# Patient Record
Sex: Female | Born: 1975 | Race: White | Hispanic: No | Smoking: Never smoker
Health system: Southern US, Community
[De-identification: ages and names within clinical notes are randomized; demographics above are authoritative.]

## PROBLEM LIST (undated history)

## (undated) HISTORY — PX: BREAST ENHANCEMENT SURGERY: SHX7

---

## 2009-05-23 ENCOUNTER — Emergency Department (HOSPITAL_BASED_OUTPATIENT_CLINIC_OR_DEPARTMENT_OTHER): Admission: EM | Admit: 2009-05-23 | Discharge: 2009-05-23 | Payer: Self-pay | Admitting: Emergency Medicine

## 2014-12-27 ENCOUNTER — Encounter (HOSPITAL_COMMUNITY): Payer: Self-pay | Admitting: *Deleted

## 2014-12-27 ENCOUNTER — Emergency Department (HOSPITAL_COMMUNITY): Payer: BLUE CROSS/BLUE SHIELD

## 2014-12-27 DIAGNOSIS — R413 Other amnesia: Secondary | ICD-10-CM | POA: Diagnosis present

## 2014-12-27 DIAGNOSIS — R200 Anesthesia of skin: Secondary | ICD-10-CM | POA: Insufficient documentation

## 2014-12-27 DIAGNOSIS — G454 Transient global amnesia: Principal | ICD-10-CM | POA: Insufficient documentation

## 2014-12-27 DIAGNOSIS — N390 Urinary tract infection, site not specified: Secondary | ICD-10-CM | POA: Diagnosis not present

## 2014-12-27 DIAGNOSIS — R51 Headache: Secondary | ICD-10-CM | POA: Insufficient documentation

## 2014-12-27 LAB — COMPREHENSIVE METABOLIC PANEL
ALBUMIN: 3.9 g/dL (ref 3.5–5.0)
ALT: 22 U/L (ref 14–54)
AST: 28 U/L (ref 15–41)
Alkaline Phosphatase: 42 U/L (ref 38–126)
Anion gap: 8 (ref 5–15)
BUN: 13 mg/dL (ref 6–20)
CHLORIDE: 102 mmol/L (ref 101–111)
CO2: 25 mmol/L (ref 22–32)
Calcium: 8.7 mg/dL — ABNORMAL LOW (ref 8.9–10.3)
Creatinine, Ser: 0.73 mg/dL (ref 0.44–1.00)
GFR calc Af Amer: >60 mL/min (ref >60)
GFR calc non Af Amer: >60 mL/min (ref >60)
GLUCOSE: 93 mg/dL (ref 65–99)
POTASSIUM: 3.6 mmol/L (ref 3.5–5.1)
Sodium: 135 mmol/L (ref 135–145)
Total Bilirubin: 0.3 mg/dL (ref 0.3–1.2)
Total Protein: 7 g/dL (ref 6.5–8.1)

## 2014-12-27 LAB — URINALYSIS, ROUTINE W REFLEX MICROSCOPIC
Bilirubin Urine: NEGATIVE
GLUCOSE, UA: NEGATIVE mg/dL
Ketones, ur: NEGATIVE mg/dL
Nitrite: NEGATIVE
PH: 6.5 (ref 5.0–8.0)
PROTEIN: NEGATIVE mg/dL
SPECIFIC GRAVITY, URINE: 1.003 — AB (ref 1.005–1.030)
Urobilinogen, UA: 0.2 mg/dL (ref 0.0–1.0)

## 2014-12-27 LAB — CBC
HCT: 39 % (ref 36.0–46.0)
Hemoglobin: 14.2 g/dL (ref 12.0–15.0)
MCH: 33.5 pg (ref 26.0–34.0)
MCHC: 36.4 g/dL — ABNORMAL HIGH (ref 30.0–36.0)
MCV: 92 fL (ref 78.0–100.0)
PLATELETS: 260 10*3/uL (ref 150–400)
RBC: 4.24 MIL/uL (ref 3.87–5.11)
RDW: 11.4 % — AB (ref 11.5–15.5)
WBC: 8.3 10*3/uL (ref 4.0–10.5)

## 2014-12-27 LAB — URINE MICROSCOPIC-ADD ON

## 2014-12-27 NOTE — ED Notes (Addendum)
pts friend states that the facial pt was getting increases heat and blood flow to face. Pt has had facial in the past.

## 2014-12-27 NOTE — ED Notes (Addendum)
Pt states that she was getting a facial and experienced short term memory loss. States that she could not recall her grandsons name or her friends husbands name. Also c/o rt hand numbness. Symptoms last less than 20 minutes. Spoke with Dr Madilyn Hook regarding pt, orders received.

## 2014-12-28 ENCOUNTER — Inpatient Hospital Stay (HOSPITAL_COMMUNITY): Payer: BLUE CROSS/BLUE SHIELD

## 2014-12-28 ENCOUNTER — Observation Stay (HOSPITAL_COMMUNITY)
Admission: EM | Admit: 2014-12-28 | Discharge: 2014-12-28 | Disposition: A | Discharge status: Home / Self Care | Payer: BLUE CROSS/BLUE SHIELD | Attending: Internal Medicine | Admitting: Internal Medicine

## 2014-12-28 ENCOUNTER — Encounter (HOSPITAL_COMMUNITY): Payer: Self-pay | Admitting: *Deleted

## 2014-12-28 ENCOUNTER — Observation Stay (HOSPITAL_BASED_OUTPATIENT_CLINIC_OR_DEPARTMENT_OTHER)
Admit: 2014-12-28 | Discharge: 2014-12-28 | Disposition: A | Discharge status: Home / Self Care | Payer: BLUE CROSS/BLUE SHIELD | Attending: Neurology | Admitting: Neurology

## 2014-12-28 DIAGNOSIS — R410 Disorientation, unspecified: Secondary | ICD-10-CM | POA: Diagnosis not present

## 2014-12-28 DIAGNOSIS — R202 Paresthesia of skin: Secondary | ICD-10-CM

## 2014-12-28 DIAGNOSIS — R413 Other amnesia: Secondary | ICD-10-CM | POA: Diagnosis not present

## 2014-12-28 DIAGNOSIS — N3000 Acute cystitis without hematuria: Secondary | ICD-10-CM | POA: Diagnosis not present

## 2014-12-28 DIAGNOSIS — N390 Urinary tract infection, site not specified: Secondary | ICD-10-CM | POA: Diagnosis present

## 2014-12-28 LAB — TSH: TSH: 2.808 u[IU]/mL (ref 0.350–4.500)

## 2014-12-28 LAB — RAPID URINE DRUG SCREEN, HOSP PERFORMED
AMPHETAMINES: NOT DETECTED
Barbiturates: NOT DETECTED
Benzodiazepines: NOT DETECTED
COCAINE: NOT DETECTED
OPIATES: NOT DETECTED
TETRAHYDROCANNABINOL: NOT DETECTED

## 2014-12-28 LAB — VITAMIN B12: Vitamin B-12: 680 pg/mL (ref 180–914)

## 2014-12-28 LAB — CORTISOL: CORTISOL PLASMA: 6.1 ug/dL

## 2014-12-28 MED ORDER — CIPROFLOXACIN HCL 500 MG PO TABS: 500.0000 mg | ORAL_TABLET | Freq: Two times a day (BID) | ORAL | Status: AC

## 2014-12-28 MED ORDER — ONDANSETRON HCL 4 MG/2ML IJ SOLN: 4.0000 mg | Freq: Four times a day (QID) | INTRAMUSCULAR | Status: DC | PRN

## 2014-12-28 MED ORDER — CIPROFLOXACIN HCL 500 MG PO TABS
750.0000 mg | ORAL_TABLET | Freq: Two times a day (BID) | ORAL | Status: DC
  Administered 2014-12-28: 750 mg via ORAL
  Filled 2014-12-28 (×2): qty 1

## 2014-12-28 MED ORDER — ACETAMINOPHEN 325 MG PO TABS: 650.0000 mg | ORAL_TABLET | Freq: Four times a day (QID) | ORAL | Status: DC | PRN

## 2014-12-28 MED ORDER — IBUPROFEN 200 MG PO TABS
600.0000 mg | ORAL_TABLET | Freq: Once | ORAL | Status: DC
  Filled 2014-12-28: qty 3

## 2014-12-28 NOTE — Procedures (Signed)
ELECTROENCEPHALOGRAM REPORT  Date of Study: 12/28/2014  Patient's Name: Stellarose Hapke MRN: [id number] Date of Birth: [birth year]  Referring Provider: Dr. Elspeth Cho  Clinical History: This is a 39 year old woman who had acute onset of memory loss lasting 20 minutes.   Medications: acetaminophen (TYLENOL) tablet 650 mg ciprofloxacin (CIPRO) tablet 750 mg ibuprofen (ADVIL,MOTRIN) tablet 600 mg ondansetron (ZOFRAN) injection 4 mg  Technical Summary: A multichannel digital EEG recording measured by the international 10-20 system with electrodes applied with paste and impedances below 5000 ohms performed in our laboratory with EKG monitoring in an awake and drowsy patient.  Hyperventilation and photic stimulation were performed.  The digital EEG was referentially recorded, reformatted, and digitally filtered in a variety of bipolar and referential montages for optimal display.    Description: The patient is awake and drowsy during the recording.  During maximal wakefulness, there is a symmetric, medium voltage 9 Hz posterior dominant rhythm that attenuates with eye opening.  The record is symmetric.  During drowsiness, there is an increase in theta slowing of the background. Deeper stages of sleep were not seen. Hyperventilation and photic stimulation did not elicit any abnormalities.  There were no epileptiform discharges or electrographic seizures seen.    EKG lead was unremarkable.  Impression: This awake and drowsy EEG is normal.    Clinical Correlation: A normal EEG does not exclude a clinical diagnosis of epilepsy. Clinical correlation is advised.   Patrcia Dolly, M.D.

## 2014-12-28 NOTE — Consult Note (Signed)
Reason for Consult:Episode of confusion Referring Physician: Oni  CC: Episode of confusion  HPI: Erin Stewart is an 39 y.o. female who reports that she was getting a facial today.  While talking with her friend had the acute onset of memory loss.  Could not remember the people she was talking about.  Could not remember her grandson's name.  Memory returned after about 20 minutes.  She decided to go out to eat at that time.  While eating her right thumb became numb for a few minutes.  Patient decided to present for follow up at that time. Patient was fully aware of both events.   Has had no previous similar events.     History reviewed. No pertinent past medical history.  History reviewed. No pertinent past surgical history.  Family history: Mother alive and well.  Father with CAD s/p MIx2  Social History:  reports that she has never smoked. She does not have any smokeless tobacco history on file. She reports that she drinks alcohol. She reports that she does not use illicit drugs.  No Known Allergies  Medications: I have reviewed the patient's current medications. Prior to Admission:  None  ROS: History obtained from the patient  General ROS: negative for - chills, fatigue, fever, night sweats, weight gain or weight loss Psychological ROS: as noted in HPI Ophthalmic ROS: negative for - blurry vision, double vision, eye pain or loss of vision ENT ROS: negative for - epistaxis, nasal discharge, oral lesions, sore throat, tinnitus or vertigo Allergy and Immunology ROS: negative for - hives or itchy/watery eyes Hematological and Lymphatic ROS: negative for - bleeding problems, bruising or swollen lymph nodes Endocrine ROS: negative for - galactorrhea, hair pattern changes, polydipsia/polyuria or temperature intolerance Respiratory ROS: negative for - cough, hemoptysis, shortness of breath or wheezing Cardiovascular ROS: negative for - chest pain, dyspnea on exertion, edema or irregular  heartbeat Gastrointestinal ROS: negative for - abdominal pain, diarrhea, hematemesis, nausea/vomiting or stool incontinence Genito-Urinary ROS: negative for - dysuria, hematuria, incontinence or urinary frequency/urgency Musculoskeletal ROS: negative for - joint swelling or muscular weakness Neurological ROS: as noted in HPI Dermatological ROS: negative for rash and skin lesion changes  Physical Examination: Blood pressure 104/71, pulse 65, temperature 98.4 F (36.9 C), temperature source Oral, resp. rate 16, last menstrual period 12/20/2014, SpO2 98 %.  HEENT-  Normocephalic, no lesions, without obvious abnormality.  Normal external eye and conjunctiva.  Normal TM's bilaterally.  Normal auditory canals and external ears. Normal external nose, mucus membranes and septum.  Normal pharynx. Cardiovascular- S1, S2 normal, pulses palpable throughout   Lungs- chest clear, no wheezing, rales, normal symmetric air entry Abdomen- soft, non-tender; bowel sounds normal; no masses,  no organomegaly Extremities- no edema Lymph-no adenopathy palpable Musculoskeletal-no joint tenderness, deformity or swelling Skin-warm and dry, no hyperpigmentation, vitiligo, or suspicious lesions  Neurological Examination Mental Status: Alert, oriented, thought content appropriate.  Speech fluent without evidence of aphasia.  Able to follow 3 step commands without difficulty. Cranial Nerves: II: Discs flat bilaterally; Visual fields grossly normal, pupils equal, round, reactive to light and accommodation III,IV, VI: ptosis not present, extra-ocular motions intact bilaterally V,VII: smile symmetric, facial light touch sensation normal bilaterally VIII: hearing normal bilaterally IX,X: gag reflex present XI: bilateral shoulder shrug XII: midline tongue extension Motor: Right : Upper extremity   5/5    Left:     Upper extremity   5/5  Lower extremity   5/5     Lower extremity  5/5 Tone and bulk:normal tone  throughout; no atrophy noted Sensory: Pinprick and light touch intact throughout, bilaterally Deep Tendon Reflexes: 2+ and symmetric throughout Plantars: Right: downgoing   Left: downgoing Cerebellar: normal finger-to-nose, normal rapid alternating movements and normal heel-to-shin test Gait: normal gait and station      Laboratory Studies:   Basic Metabolic Panel:  Recent Labs Lab 12/27/14 2217  NA 135  K 3.6  CL 102  CO2 25  GLUCOSE 93  BUN 13  CREATININE 0.73  CALCIUM 8.7*    Liver Function Tests:  Recent Labs Lab 12/27/14 2217  AST 28  ALT 22  ALKPHOS 42  BILITOT 0.3  PROT 7.0  ALBUMIN 3.9   No results for input(s): LIPASE, AMYLASE in the last 168 hours. No results for input(s): AMMONIA in the last 168 hours.  CBC:  Recent Labs Lab 12/27/14 2217  WBC 8.3  HGB 14.2  HCT 39.0  MCV 92.0  PLT 260    Cardiac Enzymes: No results for input(s): CKTOTAL, CKMB, CKMBINDEX, TROPONINI in the last 168 hours.  BNP: Invalid input(s): POCBNP  CBG: No results for input(s): GLUCAP in the last 168 hours.  Microbiology: No results found for this or any previous visit.  Coagulation Studies: No results for input(s): LABPROT, INR in the last 72 hours.  Urinalysis:  Recent Labs Lab 12/27/14 2200  COLORURINE YELLOW  LABSPEC 1.003*  PHURINE 6.5  GLUCOSEU NEGATIVE  HGBUR MODERATE*  BILIRUBINUR NEGATIVE  KETONESUR NEGATIVE  PROTEINUR NEGATIVE  UROBILINOGEN 0.2  NITRITE NEGATIVE  LEUKOCYTESUR MODERATE*    Lipid Panel:  No results found for: CHOL, TRIG, HDL, CHOLHDL, VLDL, LDLCALC  HgbA1C: No results found for: HGBA1C  Urine Drug Screen:  No results found for: LABOPIA, COCAINSCRNUR, LABBENZ, AMPHETMU, THCU, LABBARB  Alcohol Level: No results for input(s): ETH in the last 168 hours.  Other results: EKG: sinus rhythm at 78 bpm.  Imaging: Ct Head Wo Contrast  12/27/2014   CLINICAL DATA:  MOMENT OF AMS X 15 MIN'S TONIGHT PER FRIENDRIGHT ARM TINGLING  AND HAND NUMBNESSNO HX OF STROKE  EXAM: CT HEAD WITHOUT CONTRAST  TECHNIQUE: Contiguous axial images were obtained from the base of the skull through the vertex without intravenous contrast.  COMPARISON:  None.  FINDINGS: Calvarium intact. No hemorrhage or extra axial fluid. No hydrocephalus.Temporal horn left ventricle not visible; while this Cass[place] be due to individual variability, there is also subtle low attenuation present in the left temporal lobe.  IMPRESSION: Questionable low attenuation left temporal lobe associated with possible mass effect, effacing the temporal horn of the left ventricle. Infarct or mass not excluded, and further evaluation with MRI of the brain is recommended.   Electronically Signed   By: Esperanza Heir M.D.   On: 12/27/2014 23:09     Assessment/Plan: 39 year old female presenting after an episode of memory loss and an episode of right thumb numbness.  Patient is now back to baseline.  Head CT was performed and has been personally reviewed.  There is asymmetry in the temporal horns noted with a questionable area of low attenuation on the left temporal lobe.  Further work up recommended.    Recommendations: 1.  MIR of the brain 2.  EEG  Thana Farr, MD [facility] [phone] 12/28/2014, 4:26 AM

## 2014-12-28 NOTE — Progress Notes (Signed)
Subjective: No further overnight events. MRI brain imaging reviewed and overall unremarkable.   Patient seen earlier by neurologist, Dr Thad Ranger. Please refer to her note for full exam and plan.   Objective: Current vital signs: BP 107/71 mmHg  Pulse 60  Temp(Src) 97.8 F (36.6 C) (Oral)  Resp 16  Ht 5' (1.524 m)  Wt 58.145 kg (128 lb 3 oz)  BMI 25.03 kg/m2  SpO2 100%  LMP 12/20/2014 Vital signs in last 24 hours: Temp:  [97.8 F (36.6 C)-98.4 F (36.9 C)] 97.8 F (36.6 C) (09/09 0654) Pulse Rate:  [60-81] 60 (09/09 0654) Resp:  [16-20] 16 (09/09 0654) BP: (100-119)/(64-76) 107/71 mmHg (09/09 0654) SpO2:  [98 %-100 %] 100 % (09/09 0654) Weight:  [58.145 kg (128 lb 3 oz)] 58.145 kg (128 lb 3 oz) (09/09 0656)  Intake/Output from previous day:   Intake/Output this shift:   Nutritional status: Diet NPO time specified  Lab Results: Basic Metabolic Panel:  Recent Labs Lab 12/27/14 2217  NA 135  K 3.6  CL 102  CO2 25  GLUCOSE 93  BUN 13  CREATININE 0.73  CALCIUM 8.7*    Liver Function Tests:  Recent Labs Lab 12/27/14 2217  AST 28  ALT 22  ALKPHOS 42  BILITOT 0.3  PROT 7.0  ALBUMIN 3.9   No results for input(s): LIPASE, AMYLASE in the last 168 hours. No results for input(s): AMMONIA in the last 168 hours.  CBC:  Recent Labs Lab 12/27/14 2217  WBC 8.3  HGB 14.2  HCT 39.0  MCV 92.0  PLT 260    Cardiac Enzymes: No results for input(s): CKTOTAL, CKMB, CKMBINDEX, TROPONINI in the last 168 hours.  Lipid Panel: No results for input(s): CHOL, TRIG, HDL, CHOLHDL, VLDL, LDLCALC in the last 168 hours.  CBG: No results for input(s): GLUCAP in the last 168 hours.  Microbiology: No results found for this or any previous visit.  Coagulation Studies: No results for input(s): LABPROT, INR in the last 72 hours.  Imaging: Ct Head Wo Contrast  12/27/2014   CLINICAL DATA:  MOMENT OF AMS X 15 MIN'S TONIGHT PER FRIENDRIGHT ARM TINGLING AND HAND NUMBNESSNO  HX OF STROKE  EXAM: CT HEAD WITHOUT CONTRAST  TECHNIQUE: Contiguous axial images were obtained from the base of the skull through the vertex without intravenous contrast.  COMPARISON:  None.  FINDINGS: Calvarium intact. No hemorrhage or extra axial fluid. No hydrocephalus.Temporal horn left ventricle not visible; while this C[place] be due to individual variability, there is also subtle low attenuation present in the left temporal lobe.  IMPRESSION: Questionable low attenuation left temporal lobe associated with possible mass effect, effacing the temporal horn of the left ventricle. Infarct or mass not excluded, and further evaluation with MRI of the brain is recommended.   Electronically Signed   By: Esperanza Heir M.D.   On: 12/27/2014 23:09   Mr Brain Wo Contrast  12/28/2014   CLINICAL DATA:  39 year old female with episode of amnesia, subsequent right upper extremity numbness. Initial encounter.  EXAM: MRI HEAD WITHOUT CONTRAST  TECHNIQUE: Multiplanar, multiecho pulse sequences of the brain and surrounding structures were obtained without intravenous contrast.  COMPARISON:  Head CT without contrast December 27, 2014.  FINDINGS: Cerebral volume is normal. No restricted diffusion to suggest acute infarction. No midline shift, mass effect, evidence of mass lesion, ventriculomegaly, extra-axial collection or acute intracranial hemorrhage. Cervicomedullary junction and pituitary are within normal limits. Major intracranial vascular flow voids are within normal limits. Wallace Cullens and  white matter signal is within normal limits for age ; there are minimal nonspecific subcortical white matter foci of T2 and FLAIR hyperintensity (series 7, image 17). No encephalomalacia or chronic cerebral blood products. Mesial temporal lobe structures appear within normal limits.  Visible internal auditory structures appear normal. Paranasal sinuses and mastoids are clear. Orbit and scalp soft tissues are within normal limits. Negative  visualized cervical spine. Visualized bone marrow signal is within normal limits.  IMPRESSION: Normal for age noncontrast MRI appearance of the brain.   Electronically Signed   By: Odessa Fleming M.D.   On: 12/28/2014 07:14    Medications:  Scheduled: . ciprofloxacin  750 mg Oral BID  . ibuprofen  600 mg Oral Once    Assessment/Plan:  39 year old female presenting after an episode of memory loss and an episode of right thumb numbness. Patient is now back to baseline. MRI brain imaging reviewed and overall unremarkable. Awaiting EEG.   -check EEG -if EEG unremarkable no further inpatient neurological workup indicated at this time   LOS: 0 days   Elspeth Cho, DO [facility] [phone]  If 7pm- 7am, please page neurology on call as listed in AMION. 12/28/2014  8:36 AM

## 2014-12-28 NOTE — Progress Notes (Signed)
EEG Completed; Results Pending  

## 2014-12-28 NOTE — Progress Notes (Signed)
Pt admitted from ED with memory loss,pt alert and oriented, denies any pain at this time, pt settled in bed, admission doc paged for orders, call light within pt's reach, will continue to monitor. Obasogie-Asidi, Tiare Rohlman Efe

## 2014-12-28 NOTE — H&P (Signed)
[facility] History and Physical                                                                                    Erin Stewart, is a 39 y.o. female  MRN: [id number]   DOB - 02-Feb-1976  Admit Date - 12/28/2014  Outpatient Primary MD for the patient is none.  Patient is unassigned.  Referring Physician:  Dr. Mora Bellman  Chief Complaint:   Chief Complaint  Patient presents with  . Memory Loss     HPI  Erin Stewart  is a 39 y.o. female, with a past medical history of acute adrenal failure, presents to the emergency department with acute temporary memory loss, and numbness in her right thumb. Ms. Tienda attempts to eat well and exercises regularly. She used to regularly participate in exercise competitions. Yesterday late afternoon she was having a facial at [facility].  She said the facial was very strong and caused the blood to rush to her face and tingling in her feet. She was speaking with her esthetician about their mutual best friend when suddenly the patient could not remember the best friend's name or who she was. She also could not remember the esthetician's name.  Further she could not remember her grandson's name. She was alert and oriented. She describes having a headache at the time for which she took Midol. She decided that she needed something to eat. She and the esthetician went to a restaurant. While she was there she began having numbness and tingling in her right thumb. This resolved after a few minutes, but it was enough to make her come to the ER to be checked.  The patient is not on any home prescribed medications but she takes several vitamins and a supplement called "adrenal granulars ".  In the emergency department CT head showed questionable low attenuation left temporal lobe associated with possible mass effect. An MRI was recommended.  MR brain was normal. EKG was normal sinus rhythm without abnormalities, labs were within normal limits other than slightly low calcium level (8.7).  Urinalysis showed moderate leukocytes and 11-22 WBCs. Vital signs were stable.   Review of Systems   In addition to the HPI above,  No Fever-chills, + Headache, No changes with Vision or hearing, No problems swallowing food or Liquids, No Chest pain, Cough or Shortness of Breath, No Abdominal pain, No Nausea or Vomiting, Bowel movements are regular, No Blood in stool or Urine, No dysuria, No new skin rashes or bruises, No new joints pains-aches,  No recent weight gain or loss, A full 10 point Review of Systems was done, except as stated above, all other Review of Systems were negative.  Past Medical History  History reviewed. No pertinent past medical history.  History reviewed. No pertinent past surgical history.    Social History Social History  Substance Use Topics  . Smoking status: Never Smoker   . Smokeless tobacco: Not on file  . Alcohol Use: Yes     Comment: twice/month   she exercises regularly and is independent with her ADLs.  Family History Family history is negative for seizures. Her paternal grandmother had a stroke.  Medications: Adrenal Granulars Supplement B6 Vitamin supplements      No Known Allergies  Physical Exam  Vitals  Blood pressure 97/58, pulse 61, temperature 98.1 F (36.7 C), temperature source Oral, resp. rate 16, height 5' (1.524 m), weight 58.145 kg (128 lb 3 oz), last menstrual period 12/20/2014, SpO2 98 %.   General:  Well-developed, thin healthy-appearing female lying in bed in NAD, husband at bedside  Psych:  Normal affect and insight, Not Suicidal or Homicidal, Awake Alert, Oriented X 3.  Neuro:   No F.N deficits, ALL C.Nerves Intact, Strength 5/5 all 4 extremities, Sensation intact all 4 extremities.  ENT:  Ears and Eyes appear Normal, Conjunctivae clear, PER. Moist oral mucosa without erythema or exudates.  Neck:  Supple, No lymphadenopathy appreciated  Respiratory:  Symmetrical chest wall movement, Good air  movement bilaterally, CTAB.  Cardiac:  RRR, No Murmurs, no LE edema noted, no JVD.    Abdomen:  Positive bowel sounds, Soft, Non tender, Non distended,  No masses appreciated  Skin:  No Cyanosis, Normal Skin Turgor, No Skin Rash or Bruise.  Extremities:  Able to move all 4. 5/5 strength in each,  no effusions.  Data Review  CBC  Recent Labs Lab 12/27/14 2217  WBC 8.3  HGB 14.2  HCT 39.0  PLT 260  MCV 92.0  MCH 33.5  MCHC 36.4*  RDW 11.4*    Chemistries   Recent Labs Lab 12/27/14 2217  NA 135  K 3.6  CL 102  CO2 25  GLUCOSE 93  BUN 13  CREATININE 0.73  CALCIUM 8.7*  AST 28  ALT 22  ALKPHOS 42  BILITOT 0.3    Urinalysis    Component Value Date/Time   COLORURINE YELLOW 12/27/2014 2200   APPEARANCEUR CLEAR 12/27/2014 2200   LABSPEC 1.003* 12/27/2014 2200   PHURINE 6.5 12/27/2014 2200   GLUCOSEU NEGATIVE 12/27/2014 2200   HGBUR MODERATE* 12/27/2014 2200   BILIRUBINUR NEGATIVE 12/27/2014 2200   KETONESUR NEGATIVE 12/27/2014 2200   PROTEINUR NEGATIVE 12/27/2014 2200   UROBILINOGEN 0.2 12/27/2014 2200   NITRITE NEGATIVE 12/27/2014 2200   LEUKOCYTESUR MODERATE* 12/27/2014 2200    Imaging results:   Ct Head Wo Contrast  12/27/2014   CLINICAL DATA:  MOMENT OF AMS X 15 MIN'S TONIGHT PER FRIENDRIGHT ARM TINGLING AND HAND NUMBNESSNO HX OF STROKE  EXAM: CT HEAD WITHOUT CONTRAST  TECHNIQUE: Contiguous axial images were obtained from the base of the skull through the vertex without intravenous contrast.  COMPARISON:  None.  FINDINGS: Calvarium intact. No hemorrhage or extra axial fluid. No hydrocephalus.Temporal horn left ventricle not visible; while this [place] be due to individual variability, there is also subtle low attenuation present in the left temporal lobe.  IMPRESSION: Questionable low attenuation left temporal lobe associated with possible mass effect, effacing the temporal horn of the left ventricle. Infarct or mass not excluded, and further evaluation with  MRI of the brain is recommended.   Electronically Signed   By: Esperanza Heir M.D.   On: 12/27/2014 23:09   Mr Brain Wo Contrast  12/28/2014   CLINICAL DATA:  39 year old female with episode of amnesia, subsequent right upper extremity numbness. Initial encounter.  EXAM: MRI HEAD WITHOUT CONTRAST  TECHNIQUE: Multiplanar, multiecho pulse sequences of the brain and surrounding structures were obtained without intravenous contrast.  COMPARISON:  Head CT without contrast December 27, 2014.  FINDINGS: Cerebral volume is normal. No restricted diffusion to suggest acute infarction. No midline shift, mass effect, evidence of mass  lesion, ventriculomegaly, extra-axial collection or acute intracranial hemorrhage. Cervicomedullary junction and pituitary are within normal limits. Major intracranial vascular flow voids are within normal limits. Wallace Cullens and white matter signal is within normal limits for age ; there are minimal nonspecific subcortical white matter foci of T2 and FLAIR hyperintensity (series 7, image 17). No encephalomalacia or chronic cerebral blood products. Mesial temporal lobe structures appear within normal limits.  Visible internal auditory structures appear normal. Paranasal sinuses and mastoids are clear. Orbit and scalp soft tissues are within normal limits. Negative visualized cervical spine. Visualized bone marrow signal is within normal limits.  IMPRESSION: Normal for age noncontrast MRI appearance of the brain.   Electronically Signed   By: Odessa Fleming M.D.   On: 12/28/2014 07:14    My personal review of EKG: NSR, No ST changes noted.   Assessment & Plan  Active Problems:   Memory loss   UTI (urinary tract infection)  Acute episode of memory loss and numbness and tingling in right thumb. Appreciate neuro consult.  Suspect this episode Wolken have been triggered by a reaction to her chemical facial vs UTI. MR negative.  U/A mildly positive will order a culture.  Check TSH, cortisol, folate, B12,  RPR, and UDS. EEG pending.  If EEG is negative we will discharge the patient for outpatient PCP follow up.  UTI Mildly positive U/A.  Patient denies dysuria, but reports a strong history of UTIs. Will begin treatment with oral cipro. Culture pending.  To be followed up outpatient.    Consultants Called:  Neurology  Family Communication:   Husband bedside  Code Status:  Full code  Condition:  Stable  Potential Disposition: Anticipate discharge later today if EEG is negative. Patient will need PCP follow up  Time spent in minutes : [address],  PA-C on 12/28/2014 at 9:59 AM Between 7am to 7pm - Pager - [phone] After 7pm go to [email or web address] - password [email or web address] And look for the night coverage person covering me after hours  [facility]

## 2014-12-28 NOTE — Discharge Summary (Signed)
Physician Discharge Summary  Erin Stewart UJW:[id number] DOB: 11/25/1975 DOA: 12/28/2014  PCP: No primary care provider on file.  Admit date: 12/28/2014 Discharge date: 12/28/2014  Time spent: 35 minutes  Recommendations for Outpatient Follow-up:  1. Follow Urine Culture, Cortisol, Serologies Patient has agreed to establish with a primary care provider or review her results with her OB/GYN physician.  Discharge Diagnoses:  Principal Problem:   Transient memory loss Active Problems:   Memory loss   UTI (urinary tract infection)   Discharge Condition: stable.  Diet recommendation: regular  Filed Weights   12/28/14 0656  Weight: 58.145 kg (128 lb 3 oz)    History of present illness:  Erin Stewart is a 39 y.o. female, with a past medical history of acute adrenal failure in 2012, presents to the emergency department with acute temporary memory loss, and numbness in her right thumb. Erin Stewart attempts to eat well and exercises regularly. She used to regularly participate in exercise competitions. Yesterday late afternoon she was having a facial at [facility]. She said the facial was very strong and caused the blood to rush to her face and tingling in her feet. She was speaking with her esthetician about their mutual best friend when suddenly the patient could not remember the best friend's name or who she was. She also could not remember the esthetician's name. Further she could not remember her grandson's name. She was alert and oriented. She describes having a headache at the time for which she took Midol. She decided that she needed something to eat. She and the esthetician went to a restaurant. While she was there she began having numbness and tingling in her right thumb. This resolved after a few minutes, but it was enough to make her come to the ER to be checked. The patient is not on any home prescribed medications but she takes several vitamins and a supplement called "adrenal granulars  ".  In the emergency department CT head showed questionable low attenuation left temporal lobe associated with possible mass effect. An MRI was recommended. MR brain was normal. EKG was normal sinus rhythm without abnormalities, labs were within normal limits other than slightly low calcium level (8.7). Urinalysis showed moderate leukocytes and 11-22 WBCs. Vital signs were stable.  Hospital Course: During the course of her brief hospital stay the patient had a negative MRI. EEG was normal. TSH was within normal limits as was serum B-12.  Patient was given oral Cipro for suspected urinary tract infection. She will be discharged with 3 more days of oral Cipro.  Cortisol level appeared a bit low at 6.1.  We recommend outpatient primary care follow-up for review of urine cultures and further consideration of cortisol level.   Procedures:  EEG completed. Within normal limits.  Consultations:  Neurology  Discharge Exam:  General: Awake, oriented 4, appropriate, no apparent distress Cardiovascular: Regular rate and rhythm, no murmurs rubs or gallops Respiratory: Clear to auscultation bilaterally no increased work of breathing Abdomen: Soft, nontender, nondistended, positive bowel sounds, no masses. Extremities: 5 over 5 strength in each, full range of motion Neurology: No tongue deviation, no facial droop, no acute focal neuro deficits, strength and sensation are equal in each extremity.  Discharge Instructions   Discharge Instructions    Diet general    Complete by:  As directed      Discharge instructions    Complete by:  As directed   Follow up with PCP in 1-2 weeks.     Increase  activity slowly    Complete by:  As directed           Current Discharge Medication List    START taking these medications   Details  ciprofloxacin (CIPRO) 500 MG tablet Take 1 tablet (500 mg total) by mouth 2 (two) times daily. Take for 3 days then stop Qty: 6 tablet, Refills: 0       No  Known Allergies Follow-up Information    Schedule an appointment as soon as possible for a visit in 1 week to follow up.   Why:  f/u with PCP in 1-2 weeks       The results of significant diagnostics from this hospitalization (including imaging, microbiology, ancillary and laboratory) are listed below for reference.    Significant Diagnostic Studies: Ct Head Wo Contrast  12/27/2014   CLINICAL DATA:  MOMENT OF AMS X 15 MIN'S TONIGHT PER FRIENDRIGHT ARM TINGLING AND HAND NUMBNESSNO HX OF STROKE  EXAM: CT HEAD WITHOUT CONTRAST  TECHNIQUE: Contiguous axial images were obtained from the base of the skull through the vertex without intravenous contrast.  COMPARISON:  None.  FINDINGS: Calvarium intact. No hemorrhage or extra axial fluid. No hydrocephalus.Temporal horn left ventricle not visible; while this Hedlund be due to individual variability, there is also subtle low attenuation present in the left temporal lobe.  IMPRESSION: Questionable low attenuation left temporal lobe associated with possible mass effect, effacing the temporal horn of the left ventricle. Infarct or mass not excluded, and further evaluation with MRI of the brain is recommended.   Electronically Signed   By: Esperanza Heir M.D.   On: 12/27/2014 23:09   Mr Brain Wo Contrast  12/28/2014   CLINICAL DATA:  39 year old female with episode of amnesia, subsequent right upper extremity numbness. Initial encounter.  EXAM: MRI HEAD WITHOUT CONTRAST  TECHNIQUE: Multiplanar, multiecho pulse sequences of the brain and surrounding structures were obtained without intravenous contrast.  COMPARISON:  Head CT without contrast December 27, 2014.  FINDINGS: Cerebral volume is normal. No restricted diffusion to suggest acute infarction. No midline shift, mass effect, evidence of mass lesion, ventriculomegaly, extra-axial collection or acute intracranial hemorrhage. Cervicomedullary junction and pituitary are within normal limits. Major intracranial vascular  flow voids are within normal limits. Wallace Cullens and white matter signal is within normal limits for age ; there are minimal nonspecific subcortical white matter foci of T2 and FLAIR hyperintensity (series 7, image 17). No encephalomalacia or chronic cerebral blood products. Mesial temporal lobe structures appear within normal limits.  Visible internal auditory structures appear normal. Paranasal sinuses and mastoids are clear. Orbit and scalp soft tissues are within normal limits. Negative visualized cervical spine. Visualized bone marrow signal is within normal limits.  IMPRESSION: Normal for age noncontrast MRI appearance of the brain.   Electronically Signed   By: Odessa Fleming M.D.   On: 12/28/2014 07:14     Labs: Basic Metabolic Panel:  Recent Labs Lab 12/27/14 2217  NA 135  K 3.6  CL 102  CO2 25  GLUCOSE 93  BUN 13  CREATININE 0.73  CALCIUM 8.7*   Liver Function Tests:  Recent Labs Lab 12/27/14 2217  AST 28  ALT 22  ALKPHOS 42  BILITOT 0.3  PROT 7.0  ALBUMIN 3.9    CBC:  Recent Labs Lab 12/27/14 2217  WBC 8.3  HGB 14.2  HCT 39.0  MCV 92.0  PLT 260     Results for Erin Stewart, Erin Stewart (MRN [id number]) as of 12/28/2014 15:52  Ref.  Range 12/28/2014 09:37  Vitamin B-12 Latest Ref Range: 180-914 pg/mL 680  Cortisol, Plasma Latest Units: ug/dL 6.1  TSH Latest Ref Range: 0.350-4.500 uIU/mL 2.808    Results for Erin Stewart, Erin Stewart (MRN [id number]) as of 12/28/2014 15:52  Ref. Range 12/27/2014 22:00  Appearance Latest Ref Range: CLEAR  CLEAR  Bacteria, UA Latest Ref Range: RARE  FEW (A)  Bilirubin Urine Latest Ref Range: NEGATIVE  NEGATIVE  Color, Urine Latest Ref Range: YELLOW  YELLOW  Glucose Latest Ref Range: NEGATIVE mg/dL NEGATIVE  Hgb urine dipstick Latest Ref Range: NEGATIVE  MODERATE (A)  Ketones, ur Latest Ref Range: NEGATIVE mg/dL NEGATIVE  Leukocytes, UA Latest Ref Range: NEGATIVE  MODERATE (A)  Nitrite Latest Ref Range: NEGATIVE  NEGATIVE  pH Latest Ref Range: 5.0-8.0  6.5  Protein  Latest Ref Range: NEGATIVE mg/dL NEGATIVE  RBC / HPF Latest Ref Range: <3 RBC/hpf 3-6  Specific Gravity, Urine Latest Ref Range: 1.005-1.030  1.003 (L)  Squamous Epithelial / LPF Latest Ref Range: RARE  RARE  Urobilinogen, UA Latest Ref Range: 0.0-1.0 mg/dL 0.2  WBC, UA Latest Ref Range: <3 WBC/hpf 11-20    Signed:  Conley Canal [facility] 12/28/2014, 4:01 PM

## 2014-12-28 NOTE — ED Provider Notes (Signed)
CSN: [id number]     Arrival date & time 12/27/14  2118 History   This chart was scribed for Tomasita Crumble, MD by Evon Slack, ED Scribe. This patient was seen in room A01C/A01C and the patient's care was started at 2:02 AM.     Chief Complaint  Patient presents with  . Memory Loss   Patient is a 39 y.o. female presenting with altered mental status. The history is provided by the patient. No language interpreter was used.  Altered Mental Status Presenting symptoms: confusion and memory loss   Severity:  Mild Most recent episode:  Yesterday Episode history:  Single Duration:  10 minutes Progression:  Resolved Chronicity:  New Associated symptoms: no fever, no nausea, no slurred speech, no vomiting and no weakness    HPI Comments: Erin Stewart is a 39 y.o. female who presents to the Emergency Department complaining of sudden memory loss onset tonight. Pt states that she was experiencing short term memory loss while conversing with her friend. She states that she was alert to only place at the time. Pt states that the symptoms lasted for about 10-15 minutes. She states that shortly after she noticed some tingling in her right thumb. Denies weakness or facial numbness.   History reviewed. No pertinent past medical history. History reviewed. No pertinent past surgical history. No family history on file. Social History  Substance Use Topics  . Smoking status: Never Smoker   . Smokeless tobacco: None  . Alcohol Use: Yes     Comment: twice/month   OB History    No data available     Review of Systems  Constitutional: Negative for fever.  Gastrointestinal: Negative for nausea and vomiting.  Neurological: Negative for weakness.  Psychiatric/Behavioral: Positive for memory loss and confusion.  All other systems reviewed and are negative.     Allergies  Review of patient's allergies indicates no known allergies.  Home Medications   Prior to Admission medications   Not on File    BP 117/69 mmHg  Pulse 76  Temp(Src) 98.4 F (36.9 C) (Oral)  Resp 20  SpO2 99%  LMP 12/20/2014   Physical Exam  Constitutional: She is oriented to person, place, and time. She appears well-developed and well-nourished. No distress.  HENT:  Head: Normocephalic and atraumatic.  Nose: Nose normal.  Mouth/Throat: Oropharynx is clear and moist. No oropharyngeal exudate.  Eyes: Conjunctivae and EOM are normal. Pupils are equal, round, and reactive to light. No scleral icterus.  Neck: Normal range of motion. Neck supple. No JVD present. No tracheal deviation present. No thyromegaly present.  Cardiovascular: Normal rate, regular rhythm and normal heart sounds.  Exam reveals no gallop and no friction rub.   No murmur heard. Pulmonary/Chest: Effort normal and breath sounds normal. No respiratory distress. She has no wheezes. She exhibits no tenderness.  Abdominal: Soft. Bowel sounds are normal. She exhibits no distension and no mass. There is no tenderness. There is no rebound and no guarding.  Musculoskeletal: Normal range of motion. She exhibits no edema or tenderness.  Lymphadenopathy:    She has no cervical adenopathy.  Neurological: She is alert and oriented to person, place, and time. No cranial nerve deficit. She exhibits normal muscle tone.  Normal strength and sensation on all extremities. Normal cerebellar testing.   Skin: Skin is warm and dry. No rash noted. No erythema. No pallor.  Nursing note and vitals reviewed.   ED Course  Procedures (including critical care time) DIAGNOSTIC STUDIES: Oxygen Saturation is  99% on RA, normal by my interpretation.    COORDINATION OF CARE: 2:13 AM-Discussed treatment plan with pt at bedside and pt agreed to plan.     Labs Review Labs Reviewed  CBC - Abnormal; Notable for the following:    MCHC 36.4 (*)    RDW 11.4 (*)    All other components within normal limits  COMPREHENSIVE METABOLIC PANEL - Abnormal; Notable for the following:     Calcium 8.7 (*)    All other components within normal limits  URINALYSIS, ROUTINE W REFLEX MICROSCOPIC (NOT AT [facility]) - Abnormal; Notable for the following:    Specific Gravity, Urine 1.003 (*)    Hgb urine dipstick MODERATE (*)    Leukocytes, UA MODERATE (*)    All other components within normal limits  URINE MICROSCOPIC-ADD ON - Abnormal; Notable for the following:    Bacteria, UA FEW (*)    All other components within normal limits    Imaging Review Ct Head Wo Contrast  12/27/2014   CLINICAL DATA:  MOMENT OF AMS X 15 MIN'S TONIGHT PER FRIENDRIGHT ARM TINGLING AND HAND NUMBNESSNO HX OF STROKE  EXAM: CT HEAD WITHOUT CONTRAST  TECHNIQUE: Contiguous axial images were obtained from the base of the skull through the vertex without intravenous contrast.  COMPARISON:  None.  FINDINGS: Calvarium intact. No hemorrhage or extra axial fluid. No hydrocephalus.Temporal horn left ventricle not visible; while this Deshazo be due to individual variability, there is also subtle low attenuation present in the left temporal lobe.  IMPRESSION: Questionable low attenuation left temporal lobe associated with possible mass effect, effacing the temporal horn of the left ventricle. Infarct or mass not excluded, and further evaluation with MRI of the brain is recommended.   Electronically Signed   By: Esperanza Heir M.D.   On: 12/27/2014 23:09     EKG Interpretation   Date/Time:  Thursday December 27 2014 21:52:34 EDT Ventricular Rate:  78 PR Interval:  144 QRS Duration: 78 QT Interval:  406 QTC Calculation: 462 R Axis:   71 Text Interpretation:  Normal sinus rhythm Normal ECG Confirmed by Erroll Luna [phone]) on 12/28/2014 2:00:50 AM      MDM   Final diagnoses:  None   Patient persists to the emergency department for memory loss and R hand weakness.  Her neurological exam now is normal. I ordered CT scan of the head which shows possible mass effect causing her symptoms. Dr. Thad Ranger evaluated  the patient recommends for inpatient admission. She recommends for MRI and EEG to be performed. MRI was ordered from the emergency depart. [facility], Dr. Julian Reil accepted the patient to telemetry.    I personally performed the services described in this documentation, which was scribed in my presence. The recorded information has been reviewed and is accurate.    Tomasita Crumble, MD 12/28/14 0500

## 2014-12-29 LAB — RPR: RPR Ser Ql: NONREACTIVE

## 2014-12-31 LAB — FOLATE RBC
FOLATE, RBC: 937 ng/mL (ref >498)
Folate, Hemolysate: 386.2 ng/mL
HEMATOCRIT: 41.2 % (ref 34.0–46.6)

## 2016-02-17 IMAGING — CT CT HEAD W/O CM
2 series · 16 of 30 positions shown, 18 images · non-contrast
Comparison: None.

CLINICAL DATA: MOMENT OF AMS X 15 MIN'S TONIGHT PER FRIENDRIGHT ARM
TINGLING AND HAND NUMBNESSNO HX OF STROKE

EXAM:
CT HEAD WITHOUT CONTRAST
TECHNIQUE: Contiguous axial images were obtained from the base of the skull
through the vertex without intravenous contrast.

[Series 201: head w/o, idose (1) · axial · non-contrast · 0.41mm/px · z∈[+43,+153]mm · 8 of 30 slices shown, 10 images]
[im 4/30  brain]
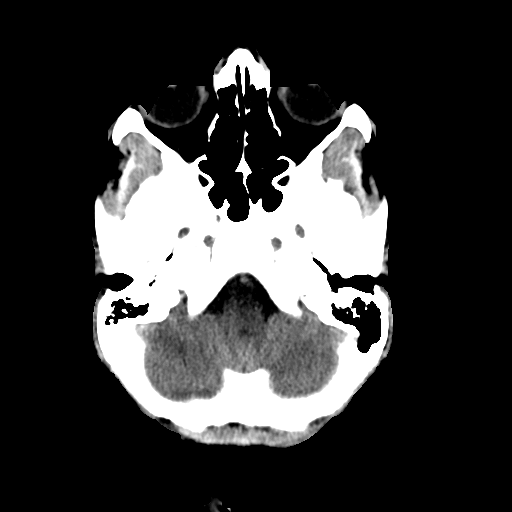
[im 4/30  bone]
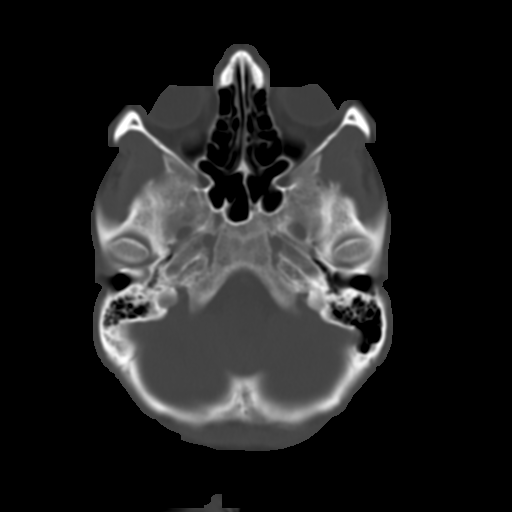
[im 7/30  brain]
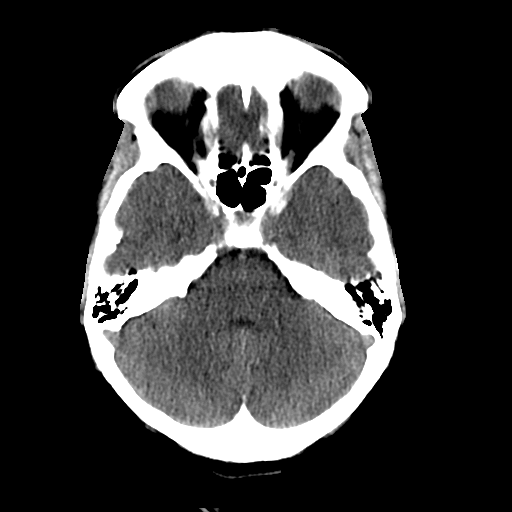
[im 10/30  brain]
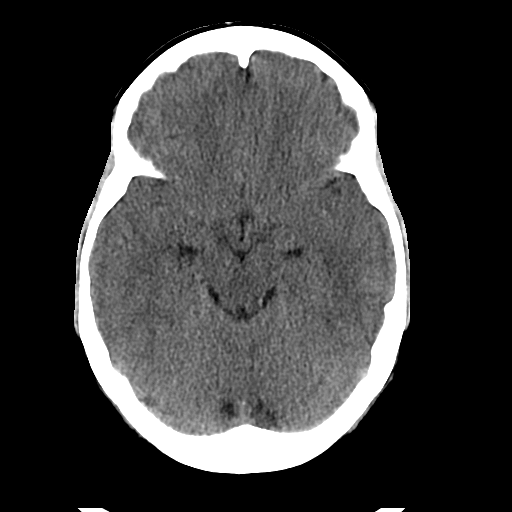
[im 13/30  brain]
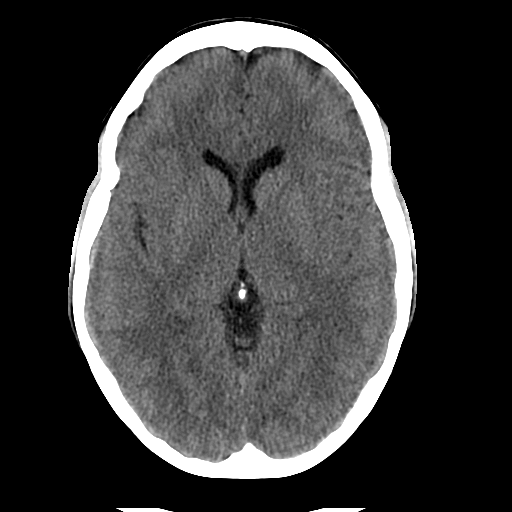
[im 17/30  brain]
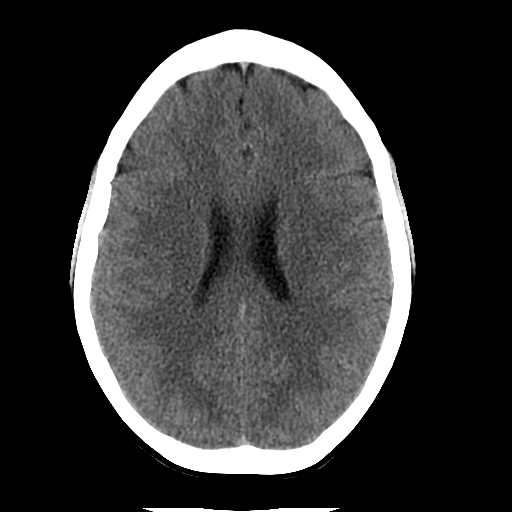
[im 17/30  bone]
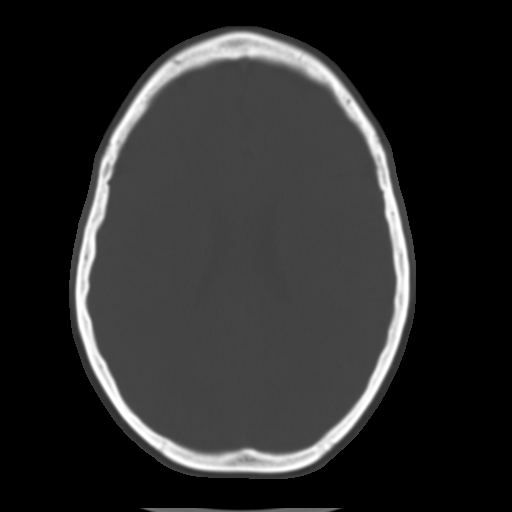
[im 20/30  brain]
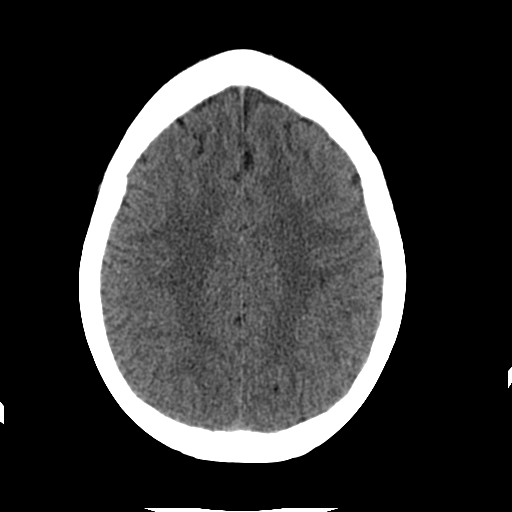
[im 23/30  brain]
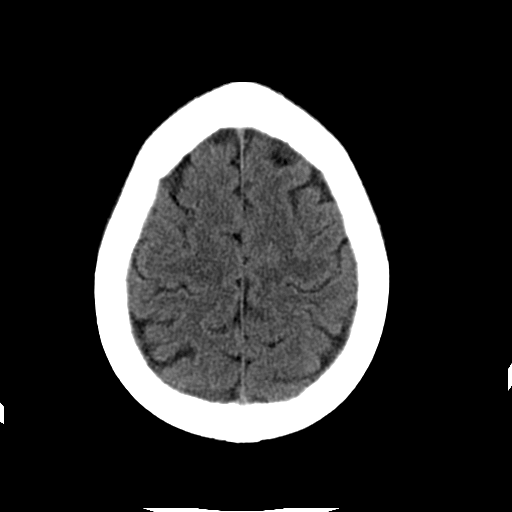
[im 26/30  brain]
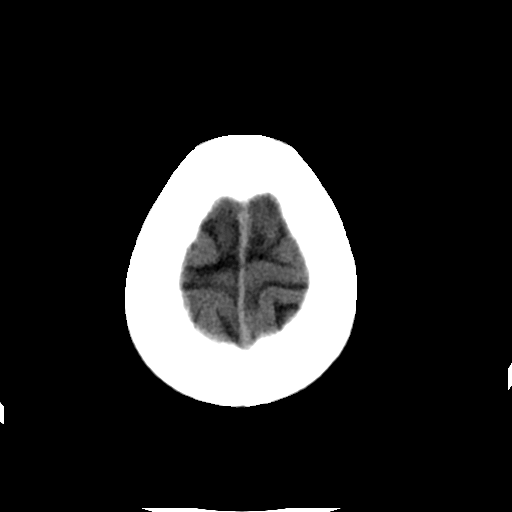

[Series 202: head w/o bone, idose (1) · axial · non-contrast · 0.41mm/px · z∈[+41,+156]mm · 8 of 60 slices shown]
[im 7/60  bone]
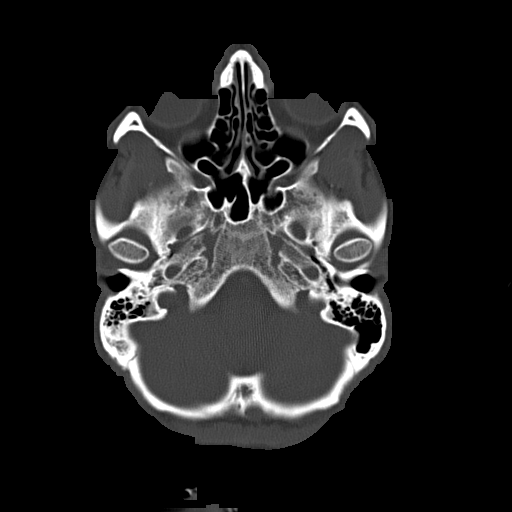
[im 13/60  bone]
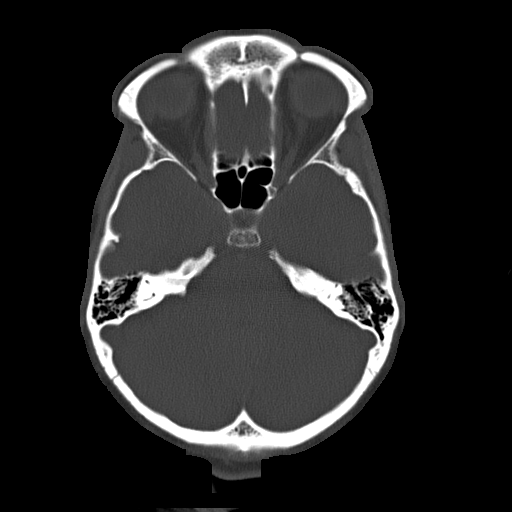
[im 19/60  bone]
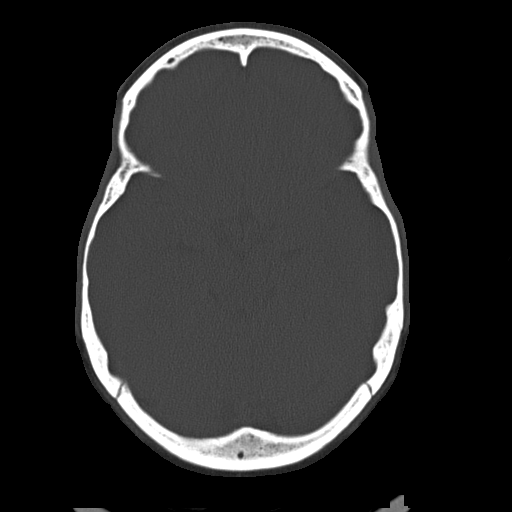
[im 25/60  bone]
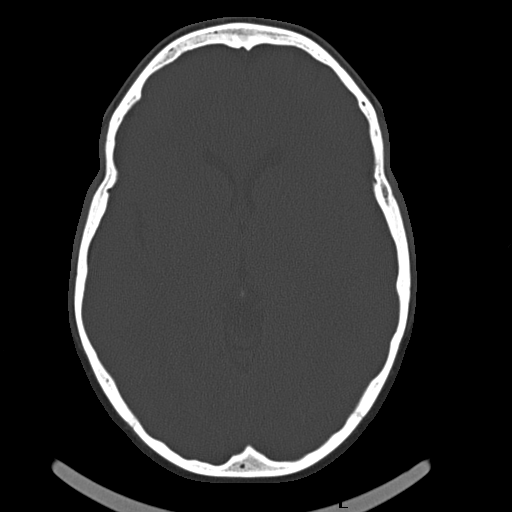
[im 35/60  bone]
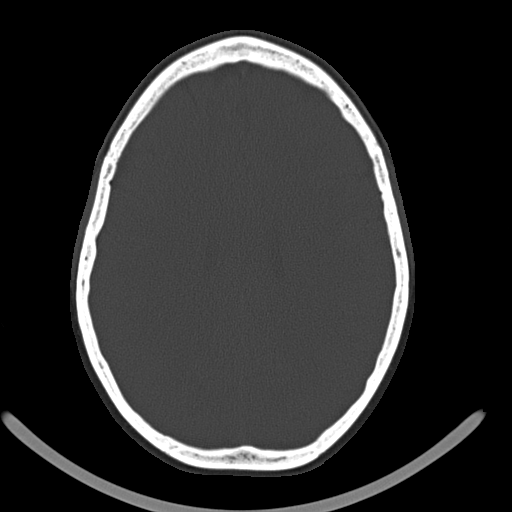
[im 41/60  bone]
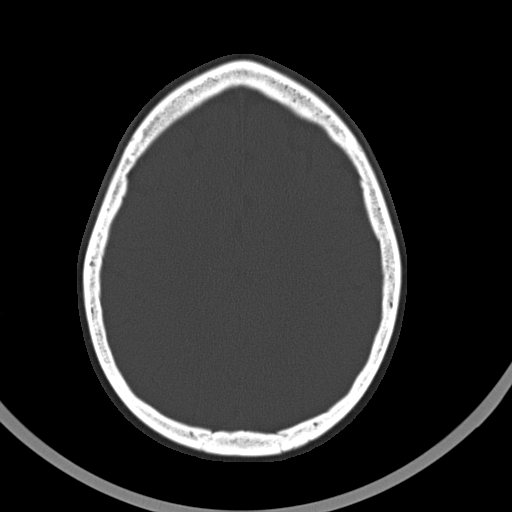
[im 47/60  bone]
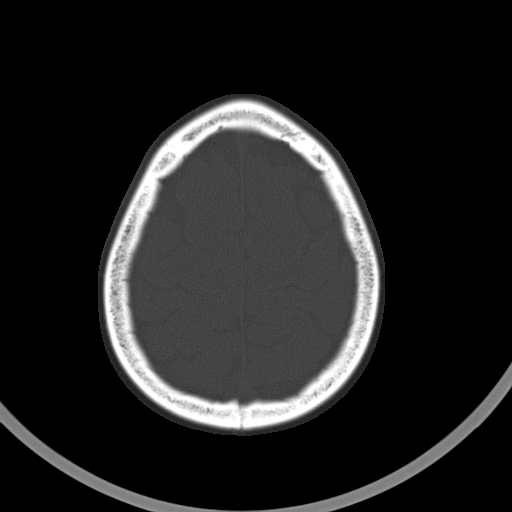
[im 53/60  bone]
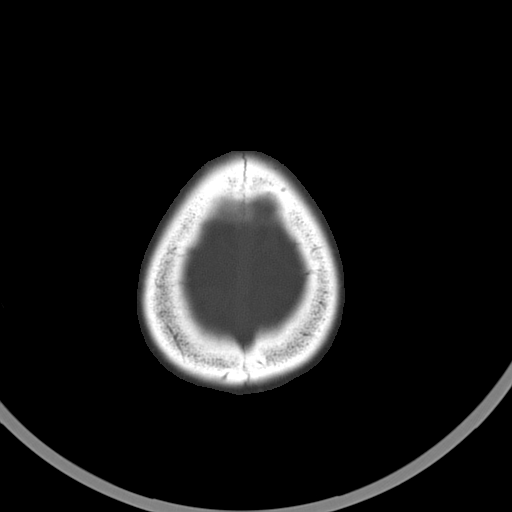

[16 of 30 positions shown; findings below may reference images not displayed]

FINDINGS: Calvarium intact. No hemorrhage or extra axial fluid. No
hydrocephalus.Temporal horn left ventricle not visible; while this
may be due to individual variability, there is also subtle low
attenuation present in the left temporal lobe.
IMPRESSION: Questionable low attenuation left temporal lobe associated with
possible mass effect, effacing the temporal horn of the left
ventricle. Infarct or mass not excluded, and further evaluation with
MRI of the brain is recommended.
